# Patient Record
Sex: Male | Born: 1955 | Race: White | Hispanic: No | Marital: Married | Smoking: Current every day smoker
Health system: Southern US, Community
[De-identification: ages and names within clinical notes are randomized; demographics above are authoritative.]

## PROBLEM LIST (undated history)

## (undated) DIAGNOSIS — H269 Unspecified cataract: Secondary | ICD-10-CM

## (undated) DIAGNOSIS — E785 Hyperlipidemia, unspecified: Secondary | ICD-10-CM

## (undated) DIAGNOSIS — N529 Male erectile dysfunction, unspecified: Secondary | ICD-10-CM

## (undated) DIAGNOSIS — G4730 Sleep apnea, unspecified: Secondary | ICD-10-CM

## (undated) DIAGNOSIS — K279 Peptic ulcer, site unspecified, unspecified as acute or chronic, without hemorrhage or perforation: Secondary | ICD-10-CM

## (undated) HISTORY — PX: OTHER SURGICAL HISTORY: SHX169

## (undated) HISTORY — DX: Male erectile dysfunction, unspecified: N52.9

## (undated) HISTORY — DX: Peptic ulcer, site unspecified, unspecified as acute or chronic, without hemorrhage or perforation: K27.9

## (undated) HISTORY — DX: Unspecified cataract: H26.9

## (undated) HISTORY — DX: Sleep apnea, unspecified: G47.30

## (undated) HISTORY — DX: Hyperlipidemia, unspecified: E78.5

---

## 2003-10-03 ENCOUNTER — Encounter: Admission: RE | Admit: 2003-10-03 | Discharge: 2003-10-03 | Payer: Self-pay | Admitting: Family Medicine

## 2008-06-11 ENCOUNTER — Emergency Department (HOSPITAL_COMMUNITY): Admission: EM | Admit: 2008-06-11 | Discharge: 2008-06-11 | Payer: Self-pay | Admitting: Emergency Medicine

## 2011-05-31 LAB — ETHANOL: Alcohol, Ethyl (B): 105 — ABNORMAL HIGH

## 2013-08-07 ENCOUNTER — Other Ambulatory Visit: Payer: Self-pay | Admitting: Family Medicine

## 2013-08-07 ENCOUNTER — Ambulatory Visit
Admission: RE | Admit: 2013-08-07 | Discharge: 2013-08-07 | Disposition: A | Discharge status: Home / Self Care | Payer: 59 | Source: Ambulatory Visit | Attending: Family Medicine | Admitting: Family Medicine

## 2013-08-07 DIAGNOSIS — R05 Cough: Secondary | ICD-10-CM

## 2013-08-07 DIAGNOSIS — R509 Fever, unspecified: Secondary | ICD-10-CM

## 2014-04-05 ENCOUNTER — Encounter: Payer: Self-pay | Admitting: *Deleted

## 2015-05-06 ENCOUNTER — Ambulatory Visit (INDEPENDENT_AMBULATORY_CARE_PROVIDER_SITE_OTHER): Payer: Commercial Managed Care - HMO | Admitting: Family Medicine

## 2015-05-06 VITALS — BP 138/82 | HR 87 | Temp 98.0°F | Resp 18 | Ht 70.0 in | Wt 190.0 lb

## 2015-05-06 DIAGNOSIS — J209 Acute bronchitis, unspecified: Secondary | ICD-10-CM

## 2015-05-06 DIAGNOSIS — F17200 Nicotine dependence, unspecified, uncomplicated: Secondary | ICD-10-CM

## 2015-05-06 DIAGNOSIS — Z720 Tobacco use: Secondary | ICD-10-CM

## 2015-05-06 MED ORDER — BENZONATATE 200 MG PO CAPS: 200.0000 mg | ORAL_CAPSULE | Freq: Three times a day (TID) | ORAL | Status: DC | PRN

## 2015-05-06 MED ORDER — IPRATROPIUM BROMIDE 0.02 % IN SOLN
0.5000 mg | Freq: Once | RESPIRATORY_TRACT | Status: AC
  Administered 2015-05-06: 0.5 mg via RESPIRATORY_TRACT

## 2015-05-06 MED ORDER — ALBUTEROL SULFATE (2.5 MG/3ML) 0.083% IN NEBU
2.5000 mg | INHALATION_SOLUTION | Freq: Once | RESPIRATORY_TRACT | Status: AC
  Administered 2015-05-06: 2.5 mg via RESPIRATORY_TRACT

## 2015-05-06 MED ORDER — AZITHROMYCIN 250 MG PO TABS: ORAL_TABLET | ORAL | Status: DC

## 2015-05-06 MED ORDER — GUAIFENESIN-CODEINE 100-10 MG/5ML PO SOLN: 5.0000 mL | Freq: Three times a day (TID) | ORAL | Status: DC | PRN

## 2015-05-06 NOTE — Patient Instructions (Signed)
Acute Bronchitis Bronchitis is inflammation of the airways that extend from the windpipe into the lungs (bronchi). The inflammation often causes mucus to develop. This leads to a cough, which is the most common symptom of bronchitis.  In acute bronchitis, the condition usually develops suddenly and goes away over time, usually in a couple weeks. Smoking, allergies, and asthma can make bronchitis worse. Repeated episodes of bronchitis may cause further lung problems.  CAUSES Acute bronchitis is most often caused by the same virus that causes a cold. The virus can spread from person to person (contagious) through coughing, sneezing, and touching contaminated objects. SIGNS AND SYMPTOMS   Cough.   Fever.   Coughing up mucus.   Body aches.   Chest congestion.   Chills.   Shortness of breath.   Sore throat.  DIAGNOSIS  Acute bronchitis is usually diagnosed through a physical exam. Your health care provider will also ask you questions about your medical history. Tests, such as chest X-rays, are sometimes done to rule out other conditions.  TREATMENT  Acute bronchitis usually goes away in a couple weeks. Oftentimes, no medical treatment is necessary. Medicines are sometimes given for relief of fever or cough. Antibiotic medicines are usually not needed but may be prescribed in certain situations. In some cases, an inhaler may be recommended to help reduce shortness of breath and control the cough. A cool mist vaporizer may also be used to help thin bronchial secretions and make it easier to clear the chest.  HOME CARE INSTRUCTIONS  Get plenty of rest.   Drink enough fluids to keep your urine clear or pale yellow (unless you have a medical condition that requires fluid restriction). Increasing fluids may help thin your respiratory secretions (sputum) and reduce chest congestion, and it will prevent dehydration.   Take medicines only as directed by your health care provider.  If  you were prescribed an antibiotic medicine, finish it all even if you start to feel better.  Avoid smoking and secondhand smoke. Exposure to cigarette smoke or irritating chemicals will make bronchitis worse. If you are a smoker, consider using nicotine gum or skin patches to help control withdrawal symptoms. Quitting smoking will help your lungs heal faster.   Reduce the chances of another bout of acute bronchitis by washing your hands frequently, avoiding people with cold symptoms, and trying not to touch your hands to your mouth, nose, or eyes.   Keep all follow-up visits as directed by your health care provider.  SEEK MEDICAL CARE IF: Your symptoms do not improve after 1 week of treatment.  SEEK IMMEDIATE MEDICAL CARE IF:  You develop an increased fever or chills.   You have chest pain.   You have severe shortness of breath.  You have bloody sputum.   You develop dehydration.  You faint or repeatedly feel like you are going to pass out.  You develop repeated vomiting.  You develop a severe headache. MAKE SURE YOU:   Understand these instructions.  Will watch your condition.  Will get help right away if you are not doing well or get worse. Document Released: 09/22/2004 Document Revised: 12/30/2013 Document Reviewed: 02/05/2013 Exit[employer] Patient Information 2015 [employer]. This information is not intended to replace advice given to you by your health care provider. Make sure you discuss any questions you have with your health care provider. Smoking Cessation, Tips for Success If you are ready to quit smoking, congratulations! You have chosen to help yourself be healthier. Cigarettes bring nicotine,   tar, carbon monoxide, and other irritants into your body. Your lungs, heart, and blood vessels will be able to work better without these poisons. There are many different ways to quit smoking. Nicotine gum, nicotine patches, a nicotine inhaler, or nicotine nasal spray can  help with physical craving. Hypnosis, support groups, and medicines help break the habit of smoking. WHAT THINGS CAN I DO TO MAKE QUITTING EASIER?  Here are some tips to help you quit for good:  Pick a date when you will quit smoking completely. Tell all of your friends and family about your plan to quit on that date.  Do not try to slowly cut down on the number of cigarettes you are smoking. Pick a quit date and quit smoking completely starting on that day.  Throw away all cigarettes.   Clean and remove all ashtrays from your home, work, and car.  On a card, write down your reasons for quitting. Carry the card with you and read it when you get the urge to smoke.  Cleanse your body of nicotine. Drink enough water and fluids to keep your urine clear or pale yellow. Do this after quitting to flush the nicotine from your body.  Learn to predict your moods. Do not let a bad situation be your excuse to have a cigarette. Some situations in your life might tempt you into wanting a cigarette.  Never have "just one" cigarette. It leads to wanting another and another. Remind yourself of your decision to quit.  Change habits associated with smoking. If you smoked while driving or when feeling stressed, try other activities to replace smoking. Stand up when drinking your coffee. Brush your teeth after eating. Sit in a different chair when you read the paper. Avoid alcohol while trying to quit, and try to drink fewer caffeinated beverages. Alcohol and caffeine may urge you to smoke.  Avoid foods and drinks that can trigger a desire to smoke, such as sugary or spicy foods and alcohol.  Ask people who smoke not to smoke around you.  Have something planned to do right after eating or having a cup of coffee. For example, plan to take a walk or exercise.  Try a relaxation exercise to calm you down and decrease your stress. Remember, you may be tense and nervous for the first 2 weeks after you quit, but  this will pass.  Find new activities to keep your hands busy. Play with a pen, coin, or rubber band. Doodle or draw things on paper.  Brush your teeth right after eating. This will help cut down on the craving for the taste of tobacco after meals. You can also try mouthwash.   Use oral substitutes in place of cigarettes. Try using lemon drops, carrots, cinnamon sticks, or chewing gum. Keep them handy so they are available when you have the urge to smoke.  When you have the urge to smoke, try deep breathing.  Designate your home as a nonsmoking area.  If you are a heavy smoker, ask your health care provider about a prescription for nicotine chewing gum. It can ease your withdrawal from nicotine.  Reward yourself. Set aside the cigarette money you save and buy yourself something nice.  Look for support from others. Join a support group or smoking cessation program. Ask someone at home or at work to help you with your plan to quit smoking.  Always ask yourself, "Do I need this cigarette or is this just a reflex?" Tell yourself, "Today, I choose   not to smoke," or "I do not want to smoke." You are reminding yourself of your decision to quit.  Do not replace cigarette smoking with electronic cigarettes (commonly called e-cigarettes). The safety of e-cigarettes is unknown, and some may contain harmful chemicals.  If you relapse, do not give up! Plan ahead and think about what you will do the next time you get the urge to smoke. HOW WILL I FEEL WHEN I QUIT SMOKING? You may have symptoms of withdrawal because your body is used to nicotine (the addictive substance in cigarettes). You may crave cigarettes, be irritable, feel very hungry, cough often, get headaches, or have difficulty concentrating. The withdrawal symptoms are only temporary. They are strongest when you first quit but will go away within 10-14 days. When withdrawal symptoms occur, stay in control. Think about your reasons for quitting.  Remind yourself that these are signs that your body is healing and getting used to being without cigarettes. Remember that withdrawal symptoms are easier to treat than the major diseases that smoking can cause.  Even after the withdrawal is over, expect periodic urges to smoke. However, these cravings are generally short lived and will go away whether you smoke or not. Do not smoke! WHAT RESOURCES ARE AVAILABLE TO HELP ME QUIT SMOKING? Your health care provider can direct you to community resources or hospitals for support, which may include:  Group support.  Education.  Hypnosis.  Therapy. Document Released: 05/13/2004 Document Revised: 12/30/2013 Document Reviewed: 01/31/2013 ExitCare Patient Information 2015 [employer]. This information is not intended to replace advice given to you by your health care provider. Make sure you discuss any questions you have with your health care provider.  

## 2015-05-06 NOTE — Progress Notes (Signed)
Subjective:    Patient ID: Dylan Riley, male    DOB: Jul 26, 1956, 59 y.o.   MRN: [id number] This chart was scribed for Delman Cheadle, MD by Zola Button, Medical Scribe. This patient was seen in Room 1 and the patient's care was started at 11:15 AM.   Chief Complaint  Patient presents with  . Cough    Onset 4 days  . Shortness of Breath  . Chest Pain    With breathing    HPI HPI Comments: Dylan Riley is a 59 y.o. male who presents to the [facility] complaining of gradual onset, waxing and waning URI symptoms that started 10 days ago. Patient reports having productive cough of yellow sputum, rhinorrhea, congestion, SOB, and chest pain with deep breaths. His symptoms are worse in the mornings. He states his symptoms have improved since yesterday. Patient denies fever and chills. He does admit to smoking. He has not had to use an inhaler in the past.   Past Medical History  Diagnosis Date  . PUD (peptic ulcer disease)   . Hyperlipidemia   . ED (erectile dysfunction)   . Sleep apnea   . Cataract    Current Outpatient Prescriptions on File Prior to Visit  Medication Sig Dispense Refill  . simvastatin (ZOCOR) 40 MG tablet Take 40 mg by mouth daily.    . diclofenac (VOLTAREN) 75 MG EC tablet Take 75 mg by mouth 2 (two) times daily.     No current facility-administered medications on file prior to visit.   No Known Allergies  Review of Systems  Constitutional: Negative for fever and chills.  HENT: Positive for congestion and rhinorrhea.   Respiratory: Positive for cough and shortness of breath.   Cardiovascular: Positive for chest pain.       Objective:  BP 138/82 mmHg  Pulse 87  Temp(Src) 98 F (36.7 C) (Oral)  Resp 18  Ht 5\' 10"  (1.778 m)  Wt 190 lb (86.183 kg)  BMI 27.26 kg/m2  SpO2 98%  Physical Exam  Constitutional: He is oriented to person, place, and time. He appears well-developed and well-nourished. No distress.  HENT:  Head:  Normocephalic and atraumatic.  Right Ear: Tympanic membrane normal.  Left Ear: Tympanic membrane normal.  Nose: Nose normal.  Mouth/Throat: Oropharynx is clear and moist. No oropharyngeal exudate, posterior oropharyngeal edema, posterior oropharyngeal erythema or tonsillar abscesses.  Nares normal.  Eyes: Pupils are equal, round, and reactive to light.  Neck: Neck supple. No thyromegaly present.  Normal thyroid.  Cardiovascular: Normal rate, regular rhythm, S1 normal, S2 normal and normal heart sounds.   No murmur heard. Pulmonary/Chest: Effort normal.  Clear to auscultation bilaterally but upper bronchiole breath sounds.  Musculoskeletal: He exhibits no edema.  Lymphadenopathy:    He has no cervical adenopathy.  Neurological: He is alert and oriented to person, place, and time. No cranial nerve deficit.  Skin: Skin is warm and dry. No rash noted.  Psychiatric: He has a normal mood and affect. His behavior is normal.  Nursing note and vitals reviewed.   Repeat exam after Duoneb treatment: symptoms unchanged.       Assessment & Plan:  Patient told to call if he wants an inhaler called for him. 1. Acute bronchitis, unspecified organism   2. Tobacco use disorder     Meds ordered this encounter  Medications  . albuterol (PROVENTIL) (2.5 MG/3ML) 0.083% nebulizer solution 2.5 mg    Sig:   . ipratropium (ATROVENT)  nebulizer solution 0.5 mg    Sig:   . azithromycin (ZITHROMAX) 250 MG tablet    Sig: Take 2 tabs PO x 1 dose, then 1 tab PO QD x 4 days    Dispense:  6 tablet    Refill:  0  . benzonatate (TESSALON) 200 MG capsule    Sig: Take 1 capsule (200 mg total) by mouth 3 (three) times daily as needed for cough.    Dispense:  60 capsule    Refill:  0  . guaiFENesin-codeine 100-10 MG/5ML syrup    Sig: Take 5-10 mLs by mouth 3 (three) times daily as needed for cough.    Dispense:  120 mL    Refill:  0    I personally performed the services described in this documentation,  which was scribed in my presence. The recorded information has been reviewed and considered, and addended by me as needed.  Delman Cheadle, MD MPH

## 2020-06-09 DIAGNOSIS — R69 Illness, unspecified: Secondary | ICD-10-CM | POA: Diagnosis not present

## 2020-06-25 DIAGNOSIS — R69 Illness, unspecified: Secondary | ICD-10-CM | POA: Diagnosis not present

## 2020-08-23 DIAGNOSIS — R69 Illness, unspecified: Secondary | ICD-10-CM | POA: Diagnosis not present

## 2020-11-14 DIAGNOSIS — J019 Acute sinusitis, unspecified: Secondary | ICD-10-CM | POA: Diagnosis not present

## 2020-11-26 DIAGNOSIS — E782 Mixed hyperlipidemia: Secondary | ICD-10-CM | POA: Diagnosis not present

## 2020-11-26 DIAGNOSIS — Z79899 Other long term (current) drug therapy: Secondary | ICD-10-CM | POA: Diagnosis not present

## 2020-11-26 DIAGNOSIS — Z1159 Encounter for screening for other viral diseases: Secondary | ICD-10-CM | POA: Diagnosis not present

## 2020-11-26 DIAGNOSIS — Z0000 Encounter for general adult medical examination without abnormal findings: Secondary | ICD-10-CM | POA: Diagnosis not present

## 2020-11-26 DIAGNOSIS — Z23 Encounter for immunization: Secondary | ICD-10-CM | POA: Diagnosis not present

## 2020-11-26 DIAGNOSIS — Z86010 Personal history of colonic polyps: Secondary | ICD-10-CM | POA: Diagnosis not present

## 2020-11-26 DIAGNOSIS — R69 Illness, unspecified: Secondary | ICD-10-CM | POA: Diagnosis not present

## 2021-04-03 DIAGNOSIS — J069 Acute upper respiratory infection, unspecified: Secondary | ICD-10-CM | POA: Diagnosis not present

## 2021-04-03 DIAGNOSIS — U071 COVID-19: Secondary | ICD-10-CM | POA: Diagnosis not present

## 2021-04-10 DIAGNOSIS — Z1152 Encounter for screening for COVID-19: Secondary | ICD-10-CM | POA: Diagnosis not present

## 2021-04-10 DIAGNOSIS — R030 Elevated blood-pressure reading, without diagnosis of hypertension: Secondary | ICD-10-CM | POA: Diagnosis not present

## 2021-04-10 DIAGNOSIS — Z8616 Personal history of COVID-19: Secondary | ICD-10-CM | POA: Diagnosis not present

## 2021-08-04 DIAGNOSIS — B078 Other viral warts: Secondary | ICD-10-CM | POA: Diagnosis not present

## 2021-08-04 DIAGNOSIS — D2239 Melanocytic nevi of other parts of face: Secondary | ICD-10-CM | POA: Diagnosis not present

## 2021-08-04 DIAGNOSIS — Z1283 Encounter for screening for malignant neoplasm of skin: Secondary | ICD-10-CM | POA: Diagnosis not present

## 2021-08-04 DIAGNOSIS — L820 Inflamed seborrheic keratosis: Secondary | ICD-10-CM | POA: Diagnosis not present

## 2021-08-04 DIAGNOSIS — D225 Melanocytic nevi of trunk: Secondary | ICD-10-CM | POA: Diagnosis not present

## 2021-10-07 DIAGNOSIS — M25561 Pain in right knee: Secondary | ICD-10-CM | POA: Diagnosis not present

## 2021-10-07 DIAGNOSIS — H6121 Impacted cerumen, right ear: Secondary | ICD-10-CM | POA: Diagnosis not present

## 2021-10-07 DIAGNOSIS — J069 Acute upper respiratory infection, unspecified: Secondary | ICD-10-CM | POA: Diagnosis not present

## 2021-11-29 DIAGNOSIS — E782 Mixed hyperlipidemia: Secondary | ICD-10-CM | POA: Diagnosis not present

## 2021-11-29 DIAGNOSIS — Z0000 Encounter for general adult medical examination without abnormal findings: Secondary | ICD-10-CM | POA: Diagnosis not present

## 2021-11-29 DIAGNOSIS — R69 Illness, unspecified: Secondary | ICD-10-CM | POA: Diagnosis not present

## 2021-11-29 DIAGNOSIS — Z122 Encounter for screening for malignant neoplasm of respiratory organs: Secondary | ICD-10-CM | POA: Diagnosis not present

## 2021-11-29 DIAGNOSIS — Z86010 Personal history of colonic polyps: Secondary | ICD-10-CM | POA: Diagnosis not present

## 2021-11-29 DIAGNOSIS — J3489 Other specified disorders of nose and nasal sinuses: Secondary | ICD-10-CM | POA: Diagnosis not present

## 2021-11-29 DIAGNOSIS — Z79899 Other long term (current) drug therapy: Secondary | ICD-10-CM | POA: Diagnosis not present

## 2021-11-29 DIAGNOSIS — Z125 Encounter for screening for malignant neoplasm of prostate: Secondary | ICD-10-CM | POA: Diagnosis not present

## 2022-01-04 ENCOUNTER — Other Ambulatory Visit: Payer: Self-pay

## 2022-01-04 DIAGNOSIS — Z122 Encounter for screening for malignant neoplasm of respiratory organs: Secondary | ICD-10-CM

## 2022-01-04 DIAGNOSIS — Z87891 Personal history of nicotine dependence: Secondary | ICD-10-CM

## 2022-01-04 DIAGNOSIS — F17210 Nicotine dependence, cigarettes, uncomplicated: Secondary | ICD-10-CM

## 2022-01-10 DIAGNOSIS — R059 Cough, unspecified: Secondary | ICD-10-CM | POA: Diagnosis not present

## 2022-01-19 ENCOUNTER — Encounter: Payer: Self-pay | Admitting: Acute Care

## 2022-01-19 ENCOUNTER — Ambulatory Visit (INDEPENDENT_AMBULATORY_CARE_PROVIDER_SITE_OTHER): Payer: Medicare HMO | Admitting: Acute Care

## 2022-01-19 DIAGNOSIS — F17210 Nicotine dependence, cigarettes, uncomplicated: Secondary | ICD-10-CM

## 2022-01-19 DIAGNOSIS — R69 Illness, unspecified: Secondary | ICD-10-CM | POA: Diagnosis not present

## 2022-01-19 NOTE — Progress Notes (Signed)
Virtual Visit via Telephone Note  I connected with JERELD PRESTI on 01/19/22 at 10:30 AM EDT by telephone and verified that I am speaking with the correct person using two identifiers.  Location: Patient:  At home Provider: [address], [place], [place], [address]    I discussed the limitations, risks, security and privacy concerns of performing an evaluation and management service by telephone and the availability of in person appointments. I also discussed with the patient that there may be a patient responsible charge related to this service. The patient expressed understanding and agreed to proceed.     Shared Decision Making Visit Lung Cancer Screening Program [phone])   Eligibility: Age 66 y.o. Pack Years Smoking History Calculation 35 pack year smoking history (# packs/per year x # years smoked) Recent History of coughing up blood  no Unexplained weight loss? no ( >Than 15 pounds within the last 6 months ) Prior History Lung / other cancer no (Diagnosis within the last 5 years already requiring surveillance chest CT Scans). Smoking Status Current Smoker Former Smokers: Years since quit:  NA  Quit Date:  NA  Visit Components: Discussion included one or more decision making aids. yes Discussion included risk/benefits of screening. yes Discussion included potential follow up diagnostic testing for abnormal scans. yes Discussion included meaning and risk of over diagnosis. yes Discussion included meaning and risk of False Positives. yes Discussion included meaning of total radiation exposure. yes  Counseling Included: Importance of adherence to annual lung cancer LDCT screening. yes Impact of comorbidities on ability to participate in the program. yes Ability and willingness to under diagnostic treatment. yes  Smoking Cessation Counseling: Current Smokers:  Discussed importance of smoking cessation. yes Information about tobacco cessation classes and  interventions provided to patient. yes Patient provided with "ticket" for LDCT Scan. yes Symptomatic Patient. no  Counseling NA Diagnosis Code: Tobacco Use Z72.0 Asymptomatic Patient yes  Counseling (Intermediate counseling: > three minutes counseling) [id number] Former Smokers:  Discussed the importance of maintaining cigarette abstinence. yes Diagnosis Code: Personal History of Nicotine Dependence. F64.332 Information about tobacco cessation classes and interventions provided to patient. Yes Patient provided with "ticket" for LDCT Scan. yes Written Order for Lung Cancer Screening with LDCT placed in Epic. Yes (CT Chest Lung Cancer Screening Low Dose W/O CM) [id number] Z12.2-Screening of respiratory organs Z87.891-Personal history of nicotine dependence  I have spent 25 minutes of face to face/ virtual visit   time with  Mr. Strothers discussing the risks and benefits of lung cancer screening. We viewed / discussed a power point together that explained in detail the above noted topics. We paused at intervals to allow for questions to be asked and answered to ensure understanding.We discussed that the single most powerful action that he can take to decrease his risk of developing lung cancer is to quit smoking. We discussed whether or not he is ready to commit to setting a quit date. We discussed options for tools to aid in quitting smoking including nicotine replacement therapy, non-nicotine medications, support groups, Quit Smart classes, and behavior modification. We discussed that often times setting smaller, more achievable goals, such as eliminating 1 cigarette a day for a week and then 2 cigarettes a day for a week can be helpful in slowly decreasing the number of cigarettes smoked. This allows for a sense of accomplishment as well as providing a clinical benefit. I provided  him  with smoking cessation  information  with contact information for community resources,  classes, free nicotine replacement  therapy, and access to mobile apps, text messaging, and on-line smoking cessation help. I have also provided  him  the office contact information in the event he needs to contact me, or the screening staff. We discussed the time and location of the scan, and that either Doroteo Glassman RN, Joella Prince, RN  or I will call / send a letter with the results within 24-72 hours of receiving them. The patient verbalized understanding of all of  the above and had no further questions upon leaving the office. They have my contact information in the event they have any further questions.  I spent 3 minutes counseling on smoking cessation and the health risks of continued tobacco abuse.  I explained to the patient that there has been a high incidence of coronary artery disease noted on these exams. I explained that this is a non-gated exam therefore degree or severity cannot be determined. This patient is on statin therapy. I have asked the patient to follow-up with their PCP regarding any incidental finding of coronary artery disease and management with diet or medication as their PCP  feels is clinically indicated. The patient verbalized understanding of the above and had no further questions upon completion of the visit.      Magdalen Spatz, NP 01/19/2022

## 2022-01-19 NOTE — Patient Instructions (Signed)
Thank you for participating in the [facility]. It was our pleasure to meet you today. We will call you with the results of your scan within the next few days. Your scan will be assigned a Lung RADS category score by the physicians reading the scans.  This Lung RADS score determines follow up scanning.  See below for description of categories, and follow up screening recommendations. We will be in touch to schedule your follow up screening annually or based on recommendations of our providers. We will fax a copy of your scan results to your Primary Care Physician, or the physician who referred you to the program, to ensure they have the results. Please call the office if you have any questions or concerns regarding your scanning experience or results.  Our office number is [phone]. Please speak with Denise Phelps, RN. , or  Denise Buckner RN, They are  our Lung Cancer Screening RN.'s If They are unavailable when you call, Please leave a message on the voice mail. We will return your call at our earliest convenience.This voice mail is monitored several times a day.  Remember, if your scan is normal, we will scan you annually as long as you continue to meet the criteria for the program. (Age 55-77, Current smoker or smoker who has quit within the last 15 years). If you are a smoker, remember, quitting is the single most powerful action that you can take to decrease your risk of lung cancer and other pulmonary, breathing related problems. We know quitting is hard, and we are here to help.  Please let us know if there is anything we can do to help you meet your goal of quitting. If you are a former smoker, congratulations. We are proud of you! Remain smoke free! Remember you can refer friends or family members through the number above.  We will screen them to make sure they meet criteria for the program. Thank you for helping us take better care of you by  participating in Lung Screening.  You can receive free nicotine replacement therapy ( patches, gum or mints) by calling 1-800-QUIT NOW. Please call so we can get you on the path to becoming  a non-smoker. I know it is hard, but you can do this!  Lung RADS Categories:  Lung RADS 1: no nodules or definitely non-concerning nodules.  Recommendation is for a repeat annual scan in 12 months.  Lung RADS 2:  nodules that are non-concerning in appearance and behavior with a very low likelihood of becoming an active cancer. Recommendation is for a repeat annual scan in 12 months.  Lung RADS 3: nodules that are probably non-concerning , includes nodules with a low likelihood of becoming an active cancer.  Recommendation is for a 6-month repeat screening scan. Often noted after an upper respiratory illness. We will be in touch to make sure you have no questions, and to schedule your 6-month scan.  Lung RADS 4 A: nodules with concerning findings, recommendation is most often for a follow up scan in 3 months or additional testing based on our provider's assessment of the scan. We will be in touch to make sure you have no questions and to schedule the recommended 3 month follow up scan.  Lung RADS 4 B:  indicates findings that are concerning. We will be in touch with you to schedule additional diagnostic testing based on our provider's  assessment of the scan.  Other options for assistance in smoking cessation (   As covered by your insurance benefits)  Hypnosis for smoking cessation  [facility] [employer] [phone]  Acupuncture for smoking cessation  [facility] [phone]   

## 2022-01-26 ENCOUNTER — Other Ambulatory Visit: Payer: Self-pay

## 2022-01-26 ENCOUNTER — Ambulatory Visit
Admission: RE | Admit: 2022-01-26 | Discharge: 2022-01-26 | Disposition: A | Discharge status: Home / Self Care | Payer: Medicare HMO | Source: Ambulatory Visit | Attending: Family Medicine | Admitting: Family Medicine

## 2022-01-26 DIAGNOSIS — Z122 Encounter for screening for malignant neoplasm of respiratory organs: Secondary | ICD-10-CM

## 2022-01-26 DIAGNOSIS — Z87891 Personal history of nicotine dependence: Secondary | ICD-10-CM

## 2022-01-26 DIAGNOSIS — F17210 Nicotine dependence, cigarettes, uncomplicated: Secondary | ICD-10-CM

## 2022-01-26 DIAGNOSIS — I2510 Atherosclerotic heart disease of native coronary artery without angina pectoris: Secondary | ICD-10-CM | POA: Diagnosis not present

## 2022-01-26 DIAGNOSIS — K8020 Calculus of gallbladder without cholecystitis without obstruction: Secondary | ICD-10-CM | POA: Diagnosis not present

## 2022-01-26 DIAGNOSIS — J432 Centrilobular emphysema: Secondary | ICD-10-CM | POA: Diagnosis not present

## 2022-01-26 DIAGNOSIS — R69 Illness, unspecified: Secondary | ICD-10-CM | POA: Diagnosis not present

## 2022-01-28 ENCOUNTER — Telehealth: Payer: Self-pay

## 2022-01-28 NOTE — Telephone Encounter (Signed)
NOTES SCANNED TO REFERRAL 

## 2022-02-06 NOTE — Progress Notes (Unsigned)
Cardiology Office Note:   Date:  02/09/2022  NAME:  Dylan Riley    MRN: [id number] DOB:  May 06, 1956   PCP:  Gaynelle Arabian, MD  Cardiologist:  None  Electrophysiologist:  None   Referring MD: Gaynelle Arabian, MD   Chief Complaint  Patient presents with   coronary calcification   History of Present Illness:   Dylan Riley is a 66 y.o. male with a hx of HLD, tobacco abuse who is being seen today for the evaluation of coronary calcifications at the request of Gaynelle Arabian, MD. CT scan dated 01/26/2022 was reviewed in office.  This demonstrates mild to moderate three-vessel CAD.  He reports no chest pain.  He is starting to exercise more since finding out that he has calcium in his heart arteries.  He reports no chest pain or pressure.  His EKG demonstrates sinus rhythm with no acute changes.  He does not have a history of hypertension.  He is a current tobacco user.  He has smoked for nearly 20 to 25 years.  He is smoking one third of a pack per day.  He does have a lipid profile with elevated triglycerides.  He reports he has started walking more and does get short of breath with activity.  Again no chest pain or pressure.  There is a strong family history of heart disease in his family members.  He is married.  He has 2 children.  He reports he is retired.  He worked for a [employer].  He is quite alarmed by the calcium in his heart.  Still smoking a third of a pack per day.  We discussed that he needs to stop smoking.  We also discussed more intensive lipid-lowering agents.  Triglycerides are elevated.  We discussed switching to Lipitor and he is okay to do this.  We also discussed coronary CTA to further evaluate things.  He does have left main coronary calcium.  T chol 113, HDL 29, LDL 39, TG 298  Problem List Coronary calcifications  HLD Tobacco abuse   Past Medical History: Past Medical History:  Diagnosis Date   Cataract    ED (erectile dysfunction)     Hyperlipidemia    PUD (peptic ulcer disease)    Sleep apnea     Past Surgical History: Past Surgical History:  Procedure Laterality Date   LEFT KNEE CYST      Current Medications: Current Meds  Medication Sig   atorvastatin (LIPITOR) 40 MG tablet Take 1 tablet (40 mg total) by mouth daily.   ibuprofen (ADVIL) 200 MG tablet Take 200 mg by mouth daily.   Melatonin (MELATONIN EXTRA STRENGTH) 10 MG TABS 1 gummy   metoprolol tartrate (LOPRESSOR) 100 MG tablet Take 1 tablet by mouth once for procedure.   Omega-3 Fatty Acids (FISH OIL) 1000 MG CAPS 1 capsule   [DISCONTINUED] simvastatin (ZOCOR) 40 MG tablet Take 40 mg by mouth daily.     Allergies:    Patient has no known allergies.   Social History: Social History   Socioeconomic History   Marital status: Married    Spouse name: Not on file   Number of children: 2   Years of education: Not on file   Highest education level: Not on file  Occupational History   Occupation: Retired [place]  Tobacco Use   Smoking status: Every Day    Years: 35.00    Types: Cigarettes   Smokeless tobacco: Not on file  Substance and Sexual  Activity   Alcohol use: No    Alcohol/week: 0.0 standard drinks of alcohol   Drug use: No   Sexual activity: Not on file  Other Topics Concern   Not on file  Social History Narrative   Not on file   Social Determinants of Health   Financial Resource Strain: Not on file  Food Insecurity: Not on file  Transportation Needs: Not on file  Physical Activity: Not on file  Stress: Not on file  Social Connections: Not on file     Family History: The patient's family history includes COPD in his father; Cancer in his sister; Diabetes in his mother; Heart disease in his mother; Hyperlipidemia in his mother.  ROS:   All other ROS reviewed and negative. Pertinent positives noted in the HPI.     EKGs/Labs/Other Studies Reviewed:   The following studies were personally reviewed by me today:  EKG:   EKG is ordered today.  The ekg ordered today demonstrates normal sinus rhythm heart rate 81, no acute ischemic changes or evidence of infarction, and was personally reviewed by me.   Recent Labs: No results found for requested labs within last 365 days.   Recent Lipid Panel No results found for: "CHOL", "TRIG", "HDL", "CHOLHDL", "VLDL", "LDLCALC", "LDLDIRECT"  Physical Exam:   VS:  BP 128/68   Pulse 81   Ht '5\' 10"'$  (1.778 m)   Wt 193 lb (87.5 kg)   SpO2 97%   BMI 27.69 kg/m    Wt Readings from Last 3 Encounters:  02/09/22 193 lb (87.5 kg)  05/06/15 190 lb (86.2 kg)    General: Well nourished, well developed, in no acute distress Head: Atraumatic, normal size  Eyes: PEERLA, EOMI  Neck: Supple, no JVD Endocrine: No thryomegaly Cardiac: Normal S1, S2; RRR; no murmurs, rubs, or gallops Lungs: Clear to auscultation bilaterally, no wheezing, rhonchi or rales  Abd: Soft, nontender, no hepatomegaly  Ext: No edema, pulses 2+ Musculoskeletal: No deformities, BUE and BLE strength normal and equal Skin: Warm and dry, no rashes   Neuro: Alert and oriented to person, place, time, and situation, CNII-XII grossly intact, no focal deficits  Psych: Normal mood and affect   ASSESSMENT:   Dylan Riley is a 66 y.o. male who presents for the following: 1. Coronary artery calcification seen on CAT scan   2. Coronary artery disease involving native coronary artery of native heart without angina pectoris   3. SOB (shortness of breath)   4. Tobacco abuse     PLAN:   1. Coronary artery calcification seen on CAT scan 2. Coronary artery disease involving native coronary artery of native heart without angina pectoris 3. SOB (shortness of breath) -Recent CT lung cancer screening with moderate coronary calcium.  Also has left main calcium.  He does get short of breath with exertion but has not been active for quite some time.  He screens negative for PAD.  He has good peripheral pulses.  His EKG in  office demonstrates sinus rhythm.  He reports no chest pain however I am a bit alarmed by the level of calcium.  I recommended coronary CTA to further evaluate this.  We do make sure he does not have any blockages that merit revascularization from a mortality standpoint.  His exam is otherwise unremarkable.  He will see me back in 3 months.  We will start him on Lipitor and stop his simvastatin.  We will recheck lipids including a direct LDL when he sees me  back.  He was advised to work on his diet and exercise more.  We will discuss further preventive strategies assuming he has no major blockage when I see him back in the office.  Smoking cessation was recommended.  See discussion below.  4. Tobacco abuse -20+ pack years.  4 minutes of smoking cessation counseling was provided in office today.  Disposition: Return in about 3 months (around 05/12/2022).  Medication Adjustments/Labs and Tests Ordered: Current medicines are reviewed at length with the patient today.  Concerns regarding medicines are outlined above.  Orders Placed This Encounter  Procedures   CT CORONARY MORPH W/CTA COR W/SCORE W/CA W/CM &/OR WO/CM   Basic metabolic panel   Lipid panel   LDL cholesterol, direct   EKG 12-Lead   Meds ordered this encounter  Medications   metoprolol tartrate (LOPRESSOR) 100 MG tablet    Sig: Take 1 tablet by mouth once for procedure.    Dispense:  1 tablet    Refill:  0   atorvastatin (LIPITOR) 40 MG tablet    Sig: Take 1 tablet (40 mg total) by mouth daily.    Dispense:  90 tablet    Refill:  3    Patient Instructions  Medication Instructions:  TAKE Metoprolol 100 mg two hours before CT scan when scheduled.   STOP Zocor (Simvastatin)  START Lipitor (Atorvastatin) 40 mg daily   *If you need a refill on your cardiac medications before your next appointment, please call your pharmacy*   Lab Work: -BMET today   -LIPID, direct LDL (come back 1 week before follow up, no lab appointment  needed, come back fasting- nothing to eat or drink)   If you have labs (blood work) drawn today and your tests are completely normal, you will receive your results only by: [place] (if you have MyChart) OR A paper copy in the mail If you have any lab test that is abnormal or we need to change your treatment, we will call you to review the results.   Testing/Procedures: Your physician has requested that you have cardiac CT. Cardiac computed tomography (CT) is a painless test that uses an x-ray machine to take clear, detailed pictures of your heart. For further information please visit [email or web address].[email or web address]. Please follow instruction sheet as given.   Follow-Up: At [facility], you and your health needs are our priority.  As part of our continuing mission to provide you with exceptional heart care, we have created designated Provider Care Teams.  These Care Teams include your primary Cardiologist (physician) and Advanced Practice Providers (APPs -  Physician Assistants and Nurse Practitioners) who all work together to provide you with the care you need, when you need it.  We recommend signing up for the patient portal called "MyChart".  Sign up information is provided on this After Visit Summary.  MyChart is used to connect with patients for Virtual Visits (Telemedicine).  Patients are able to view lab/test results, encounter notes, upcoming appointments, etc.  Non-urgent messages can be sent to your provider as well.   To learn more about what you can do with MyChart, go to [email or web address].    Your next appointment:   3 month(s)  The format for your next appointment:   In Person  Provider:   Eleonore Chiquito, MD     Other Instructions   Your cardiac CT will be scheduled at one of the below locations:   [facility] [address] [place], [place] [address] [phone]  If scheduled at [facility], please arrive at the [facility] and Children's  Entrance (Entrance C2) of [facility] 30 minutes prior to test start time. You can use the FREE valet parking offered at entrance C (encouraged to control the heart rate for the test)  Proceed to the [facility] Radiology Department (first floor) to check-in and test prep.  All radiology patients and guests should use entrance C2 at [facility], accessed from [facility], even though the hospital's physical address listed is [address]     Please follow these instructions carefully (unless otherwise directed):  Hold all erectile dysfunction medications at least 3 days (72 hrs) prior to test.  On the Night Before the Test: Be sure to Drink plenty of water. Do not consume any caffeinated/decaffeinated beverages or chocolate 12 hours prior to your test. Do not take any antihistamines 12 hours prior to your test.  On the Day of the Test: Drink plenty of water until 1 hour prior to the test. Do not eat any food 4 hours prior to the test. You may take your regular medications prior to the test.  Take metoprolol (Lopressor) two hours prior to test. HOLD Furosemide/Hydrochlorothiazide morning of the test. FEMALES- please wear underwire-free bra if available, avoid dresses & tight clothing      After the Test: Drink plenty of water. After receiving IV contrast, you may experience a mild flushed feeling. This is normal. On occasion, you may experience a mild rash up to 24 hours after the test. This is not dangerous. If this occurs, you can take Benadryl 25 mg and increase your fluid intake. If you experience trouble breathing, this can be serious. If it is severe call 911 IMMEDIATELY. If it is mild, please call our office. If you take any of these medications: Glipizide/Metformin, Avandament, Glucavance, please do not take 48 hours after completing test unless otherwise instructed.  We will call to schedule your test 2-4 weeks out understanding that some  insurance companies will need an authorization prior to the service being performed.   For non-scheduling related questions, please contact the cardiac imaging nurse navigator should you have any questions/concerns: Marchia Bond, Cardiac Imaging Nurse Navigator Gordy Clement, Cardiac Imaging Nurse Navigator [facility] Direct Office Dial: [phone]   For scheduling needs, including cancellations and rescheduling, please call [place], [phone].           Signed, Addison Naegeli. Audie Box, MD, [place]  [address], [place], [place] [address] [phone]  02/09/2022 9:24 AM

## 2022-02-09 ENCOUNTER — Ambulatory Visit (INDEPENDENT_AMBULATORY_CARE_PROVIDER_SITE_OTHER): Payer: Medicare HMO | Admitting: Cardiovascular Disease

## 2022-02-09 ENCOUNTER — Encounter: Payer: Self-pay | Admitting: Cardiovascular Disease

## 2022-02-09 VITALS — BP 128/68 | HR 81 | Ht 70.0 in | Wt 193.0 lb

## 2022-02-09 DIAGNOSIS — F17210 Nicotine dependence, cigarettes, uncomplicated: Secondary | ICD-10-CM

## 2022-02-09 DIAGNOSIS — R0602 Shortness of breath: Secondary | ICD-10-CM

## 2022-02-09 DIAGNOSIS — I2510 Atherosclerotic heart disease of native coronary artery without angina pectoris: Secondary | ICD-10-CM

## 2022-02-09 DIAGNOSIS — Z720 Tobacco use: Secondary | ICD-10-CM

## 2022-02-09 DIAGNOSIS — R69 Illness, unspecified: Secondary | ICD-10-CM | POA: Diagnosis not present

## 2022-02-09 LAB — BASIC METABOLIC PANEL
BUN/Creatinine Ratio: 20 (ref 10–24)
BUN: 15 mg/dL (ref 8–27)
CO2: 27 mmol/L (ref 20–29)
Calcium: 9.7 mg/dL (ref 8.6–10.2)
Chloride: 104 mmol/L (ref 96–106)
Creatinine, Ser: 0.75 mg/dL — ABNORMAL LOW (ref 0.76–1.27)
Glucose: 97 mg/dL (ref 70–99)
Potassium: 5 mmol/L (ref 3.5–5.2)
Sodium: 144 mmol/L (ref 134–144)
eGFR: 100 mL/min/{1.73_m2} (ref >59)

## 2022-02-09 MED ORDER — METOPROLOL TARTRATE 100 MG PO TABS: ORAL_TABLET | ORAL | 0 refills | Status: DC

## 2022-02-09 MED ORDER — ATORVASTATIN CALCIUM 40 MG PO TABS: 40.0000 mg | ORAL_TABLET | Freq: Every day | ORAL | 3 refills | Status: DC

## 2022-02-09 NOTE — Patient Instructions (Signed)
Medication Instructions:  TAKE Metoprolol 100 mg two hours before CT scan when scheduled.   STOP Zocor (Simvastatin)  START Lipitor (Atorvastatin) 40 mg daily   *If you need a refill on your cardiac medications before your next appointment, please call your pharmacy*   Lab Work: -BMET today   -LIPID, direct LDL (come back 1 week before follow up, no lab appointment needed, come back fasting- nothing to eat or drink)   If you have labs (blood work) drawn today and your tests are completely normal, you will receive your results only by: [place] (if you have MyChart) OR A paper copy in the mail If you have any lab test that is abnormal or we need to change your treatment, we will call you to review the results.   Testing/Procedures: Your physician has requested that you have cardiac CT. Cardiac computed tomography (CT) is a painless test that uses an x-ray machine to take clear, detailed pictures of your heart. For further information please visit [email or web address].[email or web address]. Please follow instruction sheet as given.   Follow-Up: At [facility], you and your health needs are our priority.  As part of our continuing mission to provide you with exceptional heart care, we have created designated Provider Care Teams.  These Care Teams include your primary Cardiologist (physician) and Advanced Practice Providers (APPs -  Physician Assistants and Nurse Practitioners) who all work together to provide you with the care you need, when you need it.  We recommend signing up for the patient portal called "MyChart".  Sign up information is provided on this After Visit Summary.  MyChart is used to connect with patients for Virtual Visits (Telemedicine).  Patients are able to view lab/test results, encounter notes, upcoming appointments, etc.  Non-urgent messages can be sent to your provider as well.   To learn more about what you can do with MyChart, go to [email or web address].    Your next  appointment:   3 month(s)  The format for your next appointment:   In Person  Provider:   Eleonore Chiquito, MD     Other Instructions   Your cardiac CT will be scheduled at one of the below locations:   [facility] [address] [place], [place] [address] [phone]   If scheduled at [facility], please arrive at the [facility] and Children's Entrance (Entrance C2) of [facility] 30 minutes prior to test start time. You can use the FREE valet parking offered at entrance C (encouraged to control the heart rate for the test)  Proceed to the [facility] Radiology Department (first floor) to check-in and test prep.  All radiology patients and guests should use entrance C2 at [facility], accessed from [facility], even though the hospital's physical address listed is [address].     Please follow these instructions carefully (unless otherwise directed):  Hold all erectile dysfunction medications at least 3 days (72 hrs) prior to test.  On the Night Before the Test: Be sure to Drink plenty of water. Do not consume any caffeinated/decaffeinated beverages or chocolate 12 hours prior to your test. Do not take any antihistamines 12 hours prior to your test.  On the Day of the Test: Drink plenty of water until 1 hour prior to the test. Do not eat any food 4 hours prior to the test. You may take your regular medications prior to the test.  Take metoprolol (Lopressor) two hours prior to test. HOLD Furosemide/Hydrochlorothiazide morning  of the test. FEMALES- please wear underwire-free bra if available, avoid dresses & tight clothing      After the Test: Drink plenty of water. After receiving IV contrast, you may experience a mild flushed feeling. This is normal. On occasion, you may experience a mild rash up to 24 hours after the test. This is not dangerous. If this occurs, you can take Benadryl 25 mg and increase your  fluid intake. If you experience trouble breathing, this can be serious. If it is severe call 911 IMMEDIATELY. If it is mild, please call our office. If you take any of these medications: Glipizide/Metformin, Avandament, Glucavance, please do not take 48 hours after completing test unless otherwise instructed.  We will call to schedule your test 2-4 weeks out understanding that some insurance companies will need an authorization prior to the service being performed.   For non-scheduling related questions, please contact the cardiac imaging nurse navigator should you have any questions/concerns: Marchia Bond, Cardiac Imaging Nurse Navigator Gordy Clement, Cardiac Imaging Nurse Navigator [place] [facility] Direct Office Dial: [phone]   For scheduling needs, including cancellations and rescheduling, please call [place], [phone].

## 2022-03-02 DIAGNOSIS — K5730 Diverticulosis of large intestine without perforation or abscess without bleeding: Secondary | ICD-10-CM | POA: Diagnosis not present

## 2022-03-02 DIAGNOSIS — K648 Other hemorrhoids: Secondary | ICD-10-CM | POA: Diagnosis not present

## 2022-03-02 DIAGNOSIS — Z09 Encounter for follow-up examination after completed treatment for conditions other than malignant neoplasm: Secondary | ICD-10-CM | POA: Diagnosis not present

## 2022-03-02 DIAGNOSIS — Z86010 Personal history of colonic polyps: Secondary | ICD-10-CM | POA: Diagnosis not present

## 2022-03-02 DIAGNOSIS — K621 Rectal polyp: Secondary | ICD-10-CM | POA: Diagnosis not present

## 2022-03-07 ENCOUNTER — Telehealth (HOSPITAL_COMMUNITY): Payer: Self-pay | Admitting: Emergency Medicine

## 2022-03-07 DIAGNOSIS — K621 Rectal polyp: Secondary | ICD-10-CM | POA: Diagnosis not present

## 2022-03-07 NOTE — Telephone Encounter (Signed)
Reaching out to patient to offer assistance regarding upcoming cardiac imaging study; pt verbalizes understanding of appt date/time, parking situation and where to check in, pre-test NPO status and medications ordered, and verified current allergies; name and call back number provided for further questions should they arise Marchia Bond RN Navigator Cardiac Imaging [facility] [phone] office [phone] cell  Arrival 1030 Denies iv issues '100mg'$  metoprolol tartrate

## 2022-03-08 ENCOUNTER — Ambulatory Visit (HOSPITAL_COMMUNITY)
Admission: RE | Admit: 2022-03-08 | Discharge: 2022-03-08 | Disposition: A | Discharge status: Home / Self Care | Payer: Medicare HMO | Source: Ambulatory Visit | Attending: Cardiovascular Disease | Admitting: Cardiovascular Disease

## 2022-03-08 ENCOUNTER — Ambulatory Visit (HOSPITAL_BASED_OUTPATIENT_CLINIC_OR_DEPARTMENT_OTHER)
Admission: RE | Admit: 2022-03-08 | Discharge: 2022-03-08 | Disposition: A | Discharge status: Home / Self Care | Payer: Medicare HMO | Source: Ambulatory Visit | Attending: Cardiology | Admitting: Cardiology

## 2022-03-08 ENCOUNTER — Other Ambulatory Visit: Payer: Self-pay | Admitting: Cardiology

## 2022-03-08 ENCOUNTER — Ambulatory Visit (HOSPITAL_COMMUNITY)
Admission: RE | Admit: 2022-03-08 | Discharge: 2022-03-08 | Disposition: A | Discharge status: Home / Self Care | Payer: Medicare HMO | Source: Ambulatory Visit | Attending: Cardiology | Admitting: Cardiology

## 2022-03-08 DIAGNOSIS — R931 Abnormal findings on diagnostic imaging of heart and coronary circulation: Secondary | ICD-10-CM

## 2022-03-08 DIAGNOSIS — I2510 Atherosclerotic heart disease of native coronary artery without angina pectoris: Secondary | ICD-10-CM

## 2022-03-08 MED ORDER — NITROGLYCERIN 0.4 MG SL SUBL
SUBLINGUAL_TABLET | SUBLINGUAL | Status: AC
  Filled 2022-03-08: qty 2

## 2022-03-08 MED ORDER — IOHEXOL 350 MG/ML SOLN
100.0000 mL | Freq: Once | INTRAVENOUS | Status: AC | PRN
  Administered 2022-03-08: 100 mL via INTRAVENOUS

## 2022-03-08 MED ORDER — NITROGLYCERIN 0.4 MG SL SUBL
0.8000 mg | SUBLINGUAL_TABLET | Freq: Once | SUBLINGUAL | Status: AC
  Administered 2022-03-08: 0.8 mg via SUBLINGUAL

## 2022-03-10 ENCOUNTER — Other Ambulatory Visit: Payer: Self-pay

## 2022-03-10 ENCOUNTER — Telehealth: Payer: Self-pay | Admitting: Cardiovascular Disease

## 2022-03-10 DIAGNOSIS — R0602 Shortness of breath: Secondary | ICD-10-CM

## 2022-03-10 NOTE — Telephone Encounter (Signed)
Pt calling to f/u on CT results. Please advise

## 2022-03-10 NOTE — Telephone Encounter (Signed)
Called pt to let him know his CT has not been reviewed yet. He verbalized understanding. Pt states "I'm not good with MyChart. Is there anyway y'all can call me with the results?" Will get message to Dr. Audie Box.

## 2022-03-10 NOTE — Telephone Encounter (Signed)
Called patient, advised of CT results.  Patient was scheduled for his upcoming ECHO- and will have completed.   Patient was thankful for call back.

## 2022-03-23 ENCOUNTER — Ambulatory Visit (HOSPITAL_COMMUNITY): Payer: Medicare HMO | Attending: Cardiology

## 2022-03-23 DIAGNOSIS — R0602 Shortness of breath: Secondary | ICD-10-CM | POA: Diagnosis not present

## 2022-03-23 LAB — ECHOCARDIOGRAM COMPLETE
Area-P 1/2: 2.69 cm2
S' Lateral: 3.2 cm

## 2022-05-16 NOTE — Progress Notes (Unsigned)
Cardiology Office Note:   Date:  05/19/2022  NAME:  NIKASH MORTENSEN    MRN: [id number] DOB:  [birth year]   PCP:  Gaynelle Arabian, MD  Cardiologist:  None  Electrophysiologist:  None   Referring MD: Gaynelle Arabian, MD   Chief Complaint  Patient presents with   Follow-up   History of Present Illness:   BOYSIE BONEBRAKE is a 66 y.o. male with a hx of CAD, hyperlipidemia, tobacco abuse who presents for follow-up.  Coronary CTA with nonobstructive CAD.  On Lipitor 40 mg daily.  Tolerating this well.  No chest pain or trouble breathing.  Still smoking cigarettes with 1 pack/day.  Working on quitting.  We discussed the importance of smoking cessation.  He is started to exercise a bit.  Doing well.  His blood pressure is well controlled.  He is working on his diet as well.  Overall without symptoms of angina.  Testing reassuring.  Problem List CAD -CAC score 874 (89th percentile) -50-69% pRCA (CT FFR 0.82) 2. HLD -T chol 113, HDL 29, LDL 39, TG 298 3. Tobacco abuse -35 pack-years   Past Medical History: Past Medical History:  Diagnosis Date   Cataract    ED (erectile dysfunction)    Hyperlipidemia    PUD (peptic ulcer disease)    Sleep apnea     Past Surgical History: Past Surgical History:  Procedure Laterality Date   LEFT KNEE CYST      Current Medications: Current Meds  Medication Sig   ibuprofen (ADVIL) 200 MG tablet Take 200 mg by mouth daily.   Melatonin (MELATONIN EXTRA STRENGTH) 10 MG TABS 1 gummy   Omega-3 Fatty Acids (FISH OIL) 1000 MG CAPS 1 capsule   [DISCONTINUED] atorvastatin (LIPITOR) 40 MG tablet Take 1 tablet (40 mg total) by mouth daily.     Allergies:    Patient has no known allergies.   Social History: Social History   Socioeconomic History   Marital status: Married    Spouse name: Not on file   Number of children: 2   Years of education: Not on file   Highest education level: Not on file  Occupational History   Occupation: Retired [place]  Tobacco Use   Smoking status: Every Day    Years: 35.00    Types: Cigarettes   Smokeless tobacco: Not on file  Substance and Sexual Activity   Alcohol use: No    Alcohol/week: 0.0 standard drinks of alcohol   Drug use: No   Sexual activity: Not on file  Other Topics Concern   Not on file  Social History Narrative   Not on file   Social Determinants of Health   Financial Resource Strain: Not on file  Food Insecurity: Not on file  Transportation Needs: Not on file  Physical Activity: Not on file  Stress: Not on file  Social Connections: Not on file     Family History: The patient's family history includes COPD in his father; Cancer in his sister; Diabetes in his mother; Heart disease in his mother; Hyperlipidemia in his mother.  ROS:   All other ROS reviewed and negative. Pertinent positives noted in the HPI.     EKGs/Labs/Other Studies Reviewed:   The following studies were personally reviewed by me today:  TTE 03/23/2022  1. Left ventricular ejection fraction, by estimation, is 55 to 60%. Left  ventricular ejection fraction by 3D volume is 56 %. The left ventricle has  normal function. The left ventricle has  no regional wall motion  abnormalities. Left ventricular diastolic   parameters are consistent with Grade I diastolic dysfunction (impaired  relaxation). The average left ventricular global longitudinal strain is  -21.4 %.   2. Right ventricular systolic function is normal. The right ventricular  size is normal. Tricuspid regurgitation signal is inadequate for assessing  PA pressure.   3. The mitral valve is normal in structure. Trivial mitral valve  regurgitation. No evidence of mitral stenosis.   4. The aortic valve is tricuspid. Aortic valve regurgitation is not  visualized. No aortic stenosis is present.   5. The inferior vena cava is normal in size with greater than 50%  respiratory variability, suggesting right atrial pressure of 3 mmHg.   Recent  Labs: 02/09/2022: BUN 15; Creatinine, Ser 0.75; Potassium 5.0; Sodium 144   Recent Lipid Panel No results found for: "CHOL", "TRIG", "HDL", "CHOLHDL", "VLDL", "LDLCALC", "LDLDIRECT"  Physical Exam:   VS:  BP 118/70 (BP Location: Left Arm, Patient Position: Sitting, Cuff Size: Normal)   Pulse 89   Ht '5\' 11"'$  (1.803 m)   Wt 192 lb (87.1 kg)   SpO2 99%   BMI 26.78 kg/m    Wt Readings from Last 3 Encounters:  05/19/22 192 lb (87.1 kg)  02/09/22 193 lb (87.5 kg)  05/06/15 190 lb (86.2 kg)    General: Well nourished, well developed, in no acute distress Head: Atraumatic, normal size  Eyes: PEERLA, EOMI  Neck: Supple, no JVD Endocrine: No thryomegaly Cardiac: Normal S1, S2; RRR; no murmurs, rubs, or gallops Lungs: Clear to auscultation bilaterally, no wheezing, rhonchi or rales  Abd: Soft, nontender, no hepatomegaly  Ext: No edema, pulses 2+ Musculoskeletal: No deformities, BUE and BLE strength normal and equal Skin: Warm and dry, no rashes   Neuro: Alert and oriented to person, place, time, and situation, CNII-XII grossly intact, no focal deficits  Psych: Normal mood and affect   ASSESSMENT:   MYCHAEL SMOCK is a 66 y.o. male who presents for the following: 1. Coronary artery disease involving native coronary artery of native heart without angina pectoris   2. Mixed hyperlipidemia   3. Tobacco abuse     PLAN:   1. Coronary artery disease involving native coronary artery of native heart without angina pectoris 2. Mixed hyperlipidemia -Coronary calcium score 874 which is 89 percentile.  Coronary CTA with moderate proximal RCA disease that was negative by CT FFR.  No symptoms concerning for angina.  Would recommend to continue with aggressive medical management.  He will take aspirin.  He is on Lipitor 40 mg daily.  We will recheck his lipids today.  Blood pressure is well controlled.  Still smoking.  See discussion below.  CV exam normal.  Echo was normal.  We discussed aggressive  diet and lifestyle modifications which she needs to pursue.  He will see [place] yearly.  3. Tobacco abuse -Still smoking 1 pack/day.  3 minutes of smoking cessation counseling was provided in office today.  I did stress to him the importance of smoking cessation to reduce his risk of heart disease progression.  CAD -CAC score 874 (89th percentile) -50-69% pRCA (CT FFR 0.82) 2. HLD -T chol 113, HDL 29, LDL 39, TG 298 3. Tobacco abuse -35 pack-years       Disposition: Return in about 1 year (around 05/20/2023).  Medication Adjustments/Labs and Tests Ordered: Current medicines are reviewed at length with the patient today.  Concerns regarding medicines are outlined above.  Orders Placed This  Encounter  Procedures   Lipid panel   Meds ordered this encounter  Medications   atorvastatin (LIPITOR) 40 MG tablet    Sig: Take 1 tablet (40 mg total) by mouth daily.    Dispense:  90 tablet    Refill:  3    Patient Instructions  Medication Instructions:  The current medical regimen is effective;  continue present plan and medications.  *If you need a refill on your cardiac medications before your next appointment, please call your pharmacy*   Lab Work: LIPID today   If you have labs (blood work) drawn today and your tests are completely normal, you will receive your results only by: [place] (if you have MyChart) OR A paper copy in the mail If you have any lab test that is abnormal or we need to change your treatment, we will call you to review the results.   Follow-Up: At [facility], you and your health needs are our priority.  As part of our continuing mission to provide you with exceptional heart care, we have created designated Provider Care Teams.  These Care Teams include your primary Cardiologist (physician) and Advanced Practice Providers (APPs -  Physician Assistants and Nurse Practitioners) who all work together to provide you with the care you need, when  you need it.  We recommend signing up for the patient portal called "MyChart".  Sign up information is provided on this After Visit Summary.  MyChart is used to connect with patients for Virtual Visits (Telemedicine).  Patients are able to view lab/test results, encounter notes, upcoming appointments, etc.  Non-urgent messages can be sent to your provider as well.   To learn more about what you can do with MyChart, go to [email or web address].    Your next appointment:   12 month(s)  The format for your next appointment:   In Person  Provider:   Eleonore Chiquito, MD or Sande Rives, PA-C, or Almyra Deforest, PA-C           Time Spent with Patient: I have spent a total of 35 minutes with patient reviewing hospital notes, telemetry, EKGs, labs and examining the patient as well as establishing an assessment and plan that was discussed with the patient.  > 50% of time was spent in direct patient care.  Signed, Addison Naegeli. Audie Box, MD, [place]  [address] [place], [place] [address] [phone]  05/19/2022 8:46 AM

## 2022-05-19 ENCOUNTER — Encounter: Payer: Self-pay | Admitting: Cardiovascular Disease

## 2022-05-19 ENCOUNTER — Ambulatory Visit: Payer: Medicare HMO | Attending: Cardiovascular Disease | Admitting: Cardiovascular Disease

## 2022-05-19 VITALS — BP 118/70 | HR 89 | Ht 71.0 in | Wt 192.0 lb

## 2022-05-19 DIAGNOSIS — I2510 Atherosclerotic heart disease of native coronary artery without angina pectoris: Secondary | ICD-10-CM

## 2022-05-19 DIAGNOSIS — F17210 Nicotine dependence, cigarettes, uncomplicated: Secondary | ICD-10-CM

## 2022-05-19 DIAGNOSIS — R69 Illness, unspecified: Secondary | ICD-10-CM | POA: Diagnosis not present

## 2022-05-19 DIAGNOSIS — E782 Mixed hyperlipidemia: Secondary | ICD-10-CM | POA: Diagnosis not present

## 2022-05-19 DIAGNOSIS — Z720 Tobacco use: Secondary | ICD-10-CM

## 2022-05-19 LAB — LIPID PANEL
Chol/HDL Ratio: 4.4 ratio (ref 0.0–5.0)
Cholesterol, Total: 131 mg/dL (ref 100–199)
HDL: 30 mg/dL — ABNORMAL LOW (ref >39)
LDL Chol Calc (NIH): 64 mg/dL (ref 0–99)
Triglycerides: 227 mg/dL — ABNORMAL HIGH (ref 0–149)
VLDL Cholesterol Cal: 37 mg/dL (ref 5–40)

## 2022-05-19 MED ORDER — ATORVASTATIN CALCIUM 40 MG PO TABS: 40.0000 mg | ORAL_TABLET | Freq: Every day | ORAL | 3 refills | Status: DC

## 2022-05-19 NOTE — Patient Instructions (Addendum)
Medication Instructions:  The current medical regimen is effective;  continue present plan and medications.  *If you need a refill on your cardiac medications before your next appointment, please call your pharmacy*   Lab Work: LIPID today   If you have labs (blood work) drawn today and your tests are completely normal, you will receive your results only by: [place] (if you have MyChart) OR A paper copy in the mail If you have any lab test that is abnormal or we need to change your treatment, we will call you to review the results.   Follow-Up: At [facility], you and your health needs are our priority.  As part of our continuing mission to provide you with exceptional heart care, we have created designated Provider Care Teams.  These Care Teams include your primary Cardiologist (physician) and Advanced Practice Providers (APPs -  Physician Assistants and Nurse Practitioners) who all work together to provide you with the care you need, when you need it.  We recommend signing up for the patient portal called "MyChart".  Sign up information is provided on this After Visit Summary.  MyChart is used to connect with patients for Virtual Visits (Telemedicine).  Patients are able to view lab/test results, encounter notes, upcoming appointments, etc.  Non-urgent messages can be sent to your provider as well.   To learn more about what you can do with MyChart, go to [email or web address].    Your next appointment:   12 month(s)  The format for your next appointment:   In Person  Provider:   Eleonore Chiquito, MD or Sande Rives, PA-C, or Almyra Deforest, [place]

## 2022-05-25 ENCOUNTER — Telehealth: Payer: Self-pay | Admitting: Cardiovascular Disease

## 2022-05-25 ENCOUNTER — Other Ambulatory Visit: Payer: Self-pay

## 2022-05-25 MED ORDER — ICOSAPENT ETHYL 1 G PO CAPS: 2.0000 g | ORAL_CAPSULE | Freq: Two times a day (BID) | ORAL | 3 refills | Status: AC

## 2022-05-25 NOTE — Telephone Encounter (Signed)
Called patient, advised of lab work results-  RX sent to pharmacy.   Patient has fish oil on his current medication do you recommend he stop this and do the Vascepa?   Thanks!

## 2022-05-25 NOTE — Telephone Encounter (Signed)
Called patient, made aware to stop over the counter fish oil and start Vascepa-  Patient verbalized understanding.

## 2022-05-25 NOTE — Telephone Encounter (Signed)
Follow Up:    Patient is calling to get his lab results from last week. If he does not answer, please leave a message. He is very concerned about his Cholesterol and Triglyceride

## 2022-10-05 DIAGNOSIS — D2271 Melanocytic nevi of right lower limb, including hip: Secondary | ICD-10-CM | POA: Diagnosis not present

## 2022-10-05 DIAGNOSIS — D225 Melanocytic nevi of trunk: Secondary | ICD-10-CM | POA: Diagnosis not present

## 2022-10-05 DIAGNOSIS — Z1283 Encounter for screening for malignant neoplasm of skin: Secondary | ICD-10-CM | POA: Diagnosis not present

## 2022-10-05 DIAGNOSIS — B078 Other viral warts: Secondary | ICD-10-CM | POA: Diagnosis not present

## 2022-10-05 DIAGNOSIS — L821 Other seborrheic keratosis: Secondary | ICD-10-CM | POA: Diagnosis not present

## 2022-10-05 DIAGNOSIS — D485 Neoplasm of uncertain behavior of skin: Secondary | ICD-10-CM | POA: Diagnosis not present

## 2022-12-07 DIAGNOSIS — Z0000 Encounter for general adult medical examination without abnormal findings: Secondary | ICD-10-CM | POA: Diagnosis not present

## 2022-12-07 DIAGNOSIS — R059 Cough, unspecified: Secondary | ICD-10-CM | POA: Diagnosis not present

## 2022-12-07 DIAGNOSIS — R69 Illness, unspecified: Secondary | ICD-10-CM | POA: Diagnosis not present

## 2022-12-07 DIAGNOSIS — R7309 Other abnormal glucose: Secondary | ICD-10-CM | POA: Diagnosis not present

## 2022-12-07 DIAGNOSIS — I2510 Atherosclerotic heart disease of native coronary artery without angina pectoris: Secondary | ICD-10-CM | POA: Diagnosis not present

## 2022-12-07 DIAGNOSIS — Z1331 Encounter for screening for depression: Secondary | ICD-10-CM | POA: Diagnosis not present

## 2022-12-07 DIAGNOSIS — Z79899 Other long term (current) drug therapy: Secondary | ICD-10-CM | POA: Diagnosis not present

## 2022-12-07 DIAGNOSIS — I700 Atherosclerosis of aorta: Secondary | ICD-10-CM | POA: Diagnosis not present

## 2022-12-07 DIAGNOSIS — E782 Mixed hyperlipidemia: Secondary | ICD-10-CM | POA: Diagnosis not present

## 2022-12-07 DIAGNOSIS — Z122 Encounter for screening for malignant neoplasm of respiratory organs: Secondary | ICD-10-CM | POA: Diagnosis not present

## 2022-12-07 DIAGNOSIS — R454 Irritability and anger: Secondary | ICD-10-CM | POA: Diagnosis not present

## 2023-01-12 DIAGNOSIS — R69 Illness, unspecified: Secondary | ICD-10-CM | POA: Diagnosis not present

## 2023-01-14 ENCOUNTER — Other Ambulatory Visit: Payer: Self-pay | Admitting: Acute Care

## 2023-01-14 DIAGNOSIS — F17210 Nicotine dependence, cigarettes, uncomplicated: Secondary | ICD-10-CM

## 2023-01-14 DIAGNOSIS — Z122 Encounter for screening for malignant neoplasm of respiratory organs: Secondary | ICD-10-CM

## 2023-01-14 DIAGNOSIS — Z87891 Personal history of nicotine dependence: Secondary | ICD-10-CM

## 2023-02-02 ENCOUNTER — Ambulatory Visit
Admission: RE | Admit: 2023-02-02 | Discharge: 2023-02-02 | Disposition: A | Discharge status: Home / Self Care | Payer: Medicare HMO | Source: Ambulatory Visit | Attending: Family Medicine | Admitting: Family Medicine

## 2023-02-02 DIAGNOSIS — F17210 Nicotine dependence, cigarettes, uncomplicated: Secondary | ICD-10-CM

## 2023-02-02 DIAGNOSIS — Z87891 Personal history of nicotine dependence: Secondary | ICD-10-CM | POA: Diagnosis not present

## 2023-02-02 DIAGNOSIS — Z122 Encounter for screening for malignant neoplasm of respiratory organs: Secondary | ICD-10-CM

## 2023-02-09 ENCOUNTER — Other Ambulatory Visit: Payer: Self-pay | Admitting: Acute Care

## 2023-02-09 DIAGNOSIS — Z122 Encounter for screening for malignant neoplasm of respiratory organs: Secondary | ICD-10-CM

## 2023-02-09 DIAGNOSIS — F17210 Nicotine dependence, cigarettes, uncomplicated: Secondary | ICD-10-CM

## 2023-02-09 DIAGNOSIS — Z87891 Personal history of nicotine dependence: Secondary | ICD-10-CM

## 2023-05-12 ENCOUNTER — Other Ambulatory Visit: Payer: Self-pay | Admitting: Cardiovascular Disease

## 2023-08-14 DIAGNOSIS — G250 Essential tremor: Secondary | ICD-10-CM | POA: Diagnosis not present

## 2023-11-15 DIAGNOSIS — F1420 Cocaine dependence, uncomplicated: Secondary | ICD-10-CM | POA: Diagnosis not present

## 2023-11-15 DIAGNOSIS — F1020 Alcohol dependence, uncomplicated: Secondary | ICD-10-CM | POA: Diagnosis not present

## 2023-11-16 DIAGNOSIS — F1020 Alcohol dependence, uncomplicated: Secondary | ICD-10-CM | POA: Diagnosis not present

## 2023-11-16 DIAGNOSIS — F1420 Cocaine dependence, uncomplicated: Secondary | ICD-10-CM | POA: Diagnosis not present

## 2023-11-17 DIAGNOSIS — F1020 Alcohol dependence, uncomplicated: Secondary | ICD-10-CM | POA: Diagnosis not present

## 2023-11-17 DIAGNOSIS — F1420 Cocaine dependence, uncomplicated: Secondary | ICD-10-CM | POA: Diagnosis not present

## 2023-11-20 DIAGNOSIS — F1420 Cocaine dependence, uncomplicated: Secondary | ICD-10-CM | POA: Diagnosis not present

## 2023-11-20 DIAGNOSIS — F1020 Alcohol dependence, uncomplicated: Secondary | ICD-10-CM | POA: Diagnosis not present

## 2023-11-21 DIAGNOSIS — F1420 Cocaine dependence, uncomplicated: Secondary | ICD-10-CM | POA: Diagnosis not present

## 2023-11-21 DIAGNOSIS — F1020 Alcohol dependence, uncomplicated: Secondary | ICD-10-CM | POA: Diagnosis not present

## 2023-11-22 DIAGNOSIS — F1420 Cocaine dependence, uncomplicated: Secondary | ICD-10-CM | POA: Diagnosis not present

## 2023-11-22 DIAGNOSIS — F1020 Alcohol dependence, uncomplicated: Secondary | ICD-10-CM | POA: Diagnosis not present

## 2023-11-24 DIAGNOSIS — F1420 Cocaine dependence, uncomplicated: Secondary | ICD-10-CM | POA: Diagnosis not present

## 2023-11-24 DIAGNOSIS — F1020 Alcohol dependence, uncomplicated: Secondary | ICD-10-CM | POA: Diagnosis not present

## 2023-11-27 DIAGNOSIS — F1020 Alcohol dependence, uncomplicated: Secondary | ICD-10-CM | POA: Diagnosis not present

## 2023-11-27 DIAGNOSIS — F1420 Cocaine dependence, uncomplicated: Secondary | ICD-10-CM | POA: Diagnosis not present

## 2024-02-12 ENCOUNTER — Other Ambulatory Visit: Payer: Self-pay | Admitting: *Deleted

## 2024-02-12 DIAGNOSIS — Z87891 Personal history of nicotine dependence: Secondary | ICD-10-CM

## 2024-02-12 DIAGNOSIS — Z122 Encounter for screening for malignant neoplasm of respiratory organs: Secondary | ICD-10-CM

## 2024-02-12 DIAGNOSIS — F17210 Nicotine dependence, cigarettes, uncomplicated: Secondary | ICD-10-CM

## 2024-02-15 ENCOUNTER — Ambulatory Visit
Admission: RE | Admit: 2024-02-15 | Discharge: 2024-02-15 | Discharge status: Home / Self Care | Source: Ambulatory Visit | Attending: Acute Care | Admitting: Acute Care

## 2024-02-15 DIAGNOSIS — Z87891 Personal history of nicotine dependence: Secondary | ICD-10-CM

## 2024-02-15 DIAGNOSIS — Z122 Encounter for screening for malignant neoplasm of respiratory organs: Secondary | ICD-10-CM | POA: Diagnosis not present

## 2024-02-15 DIAGNOSIS — F17210 Nicotine dependence, cigarettes, uncomplicated: Secondary | ICD-10-CM

## 2024-02-23 DIAGNOSIS — L259 Unspecified contact dermatitis, unspecified cause: Secondary | ICD-10-CM | POA: Diagnosis not present

## 2024-02-28 ENCOUNTER — Other Ambulatory Visit: Payer: Self-pay | Admitting: Acute Care

## 2024-02-28 DIAGNOSIS — Z87891 Personal history of nicotine dependence: Secondary | ICD-10-CM

## 2024-02-28 DIAGNOSIS — Z122 Encounter for screening for malignant neoplasm of respiratory organs: Secondary | ICD-10-CM

## 2024-03-06 ENCOUNTER — Telehealth: Payer: Self-pay | Admitting: *Deleted

## 2024-03-06 ENCOUNTER — Telehealth: Payer: Self-pay

## 2024-03-06 NOTE — Telephone Encounter (Signed)
 Spoke with patient and reviewed results. Patient had no questions. Advised result letter sent 02/28/2024.

## 2024-03-06 NOTE — Telephone Encounter (Signed)
 Copied from CRM [id number]. Topic: Clinical - Lab/Test Results >> Feb 22, 2024  2:20 PM Isabell A wrote: Reason for CRM: Patient calling to check on CT results - advised results are not back yet.Patient was thankful and verbalized understanding. >> Mar 05, 2024  3:44 PM Whitney O wrote: patient calling to get results from his lung cancer screening . Tried calling the lung cancer screening department no response and lost connection with patient . Tried callling him back but it went straight to voicemail please give patient a call concerning his results   Please contact patient.  Thank you.

## 2024-03-06 NOTE — Telephone Encounter (Signed)
 Copied from CRM [id number]. Topic: Clinical - Lab/Test Results >> Mar 05, 2024  3:54 PM Rilla B wrote: Reason for CRM: Patient called in and would like to go over CT results ordered by GORMAN Lites. Please call patient.  Please advise LCS CT. Thanks!

## 2024-03-06 NOTE — Telephone Encounter (Signed)
 Copied from CRM [id number]. Topic: Clinical - Lab/Test Results >> Mar 05, 2024  3:44 PM Whitney O wrote: patient calling to get results from his lung cancer screening . Tried calling the lung cancer screening department no response and lost connection with patient . Tried callling him back but it went straight to voicemail please give patient a call concerning his results

## 2024-03-06 NOTE — Telephone Encounter (Signed)
 Results reviewed with patient. See phone note from 03/06/2024.

## 2024-03-26 DIAGNOSIS — L308 Other specified dermatitis: Secondary | ICD-10-CM | POA: Diagnosis not present

## 2024-03-26 DIAGNOSIS — Z1283 Encounter for screening for malignant neoplasm of skin: Secondary | ICD-10-CM | POA: Diagnosis not present

## 2024-03-26 DIAGNOSIS — D225 Melanocytic nevi of trunk: Secondary | ICD-10-CM | POA: Diagnosis not present

## 2024-03-31 IMAGING — CT CT CHEST LUNG CANCER SCREENING LOW DOSE W/O CM
2 of 5 series · 15 of 40 positions shown, 18 images · non-contrast
Comparison: 08/07/2013 chest radiograph

CLINICAL DATA: Thirty-five pack-year smoking history/current
smoker.



[Series 2: lung 1.00 br60 axial · axial · 0.81mm/px · z∈[-1188,-874]mm · 12 of 348 slices shown, 15 images]
[im 17/348  mediastinal]
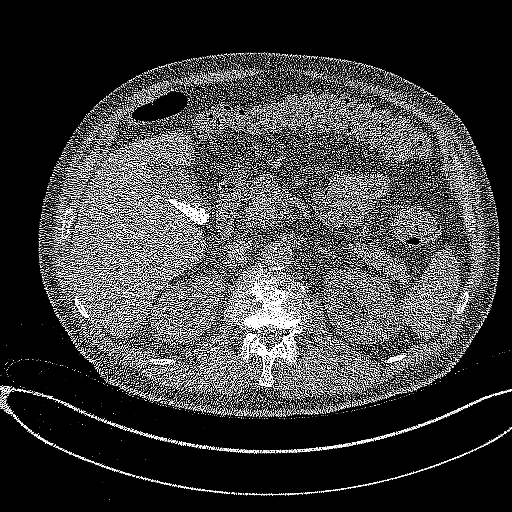
[im 17/348  lung]
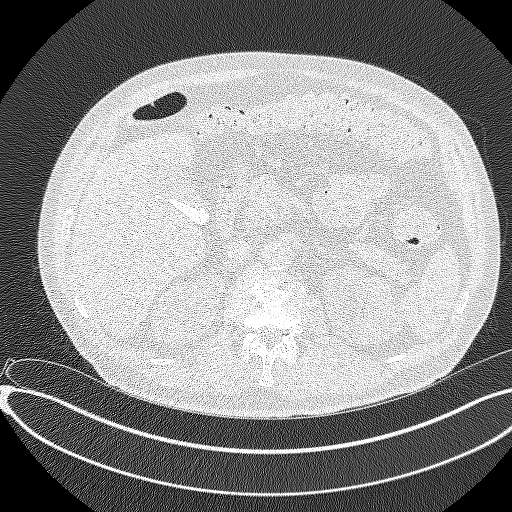
[im 50/348  lung]
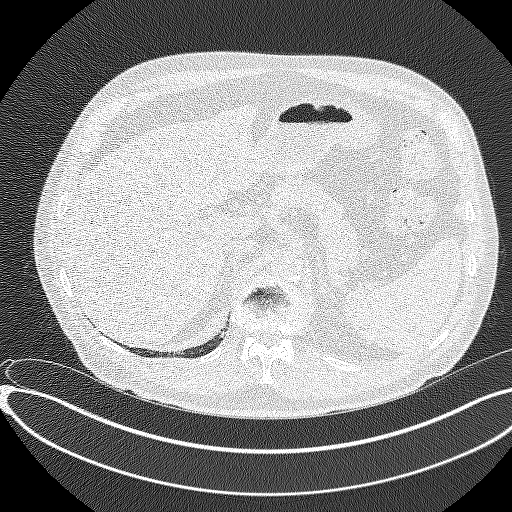
[im 83/348  lung]
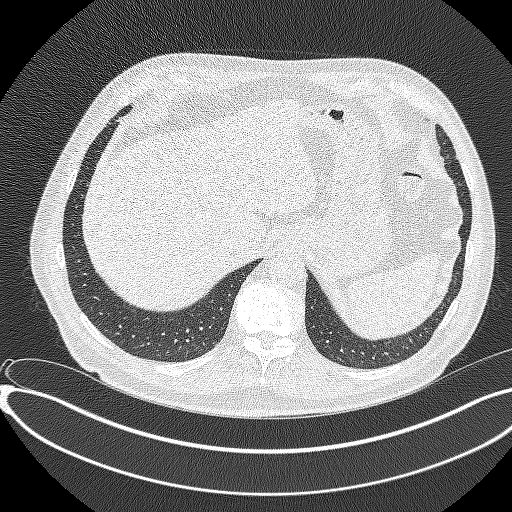
[im 100/348  lung]
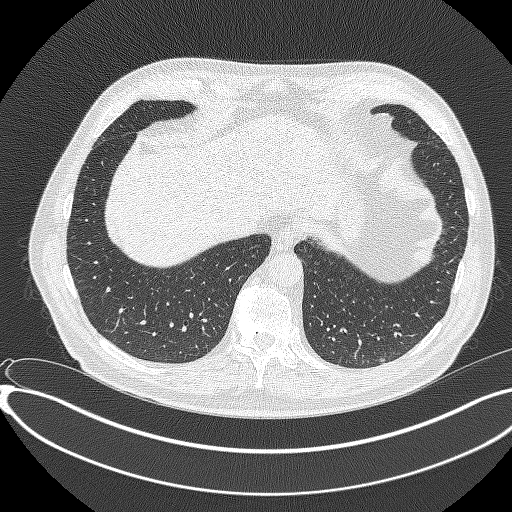
[im 133/348  mediastinal]
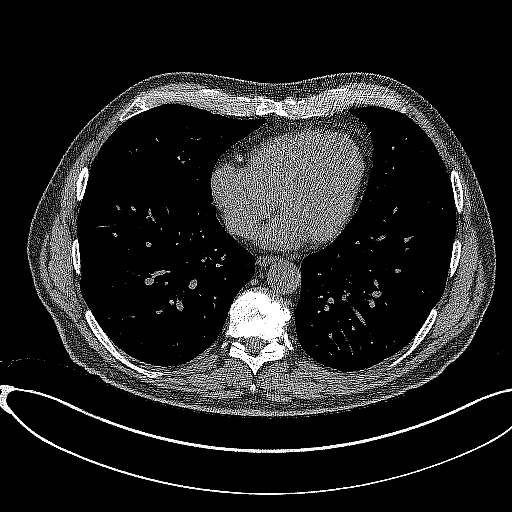
[im 133/348  lung]
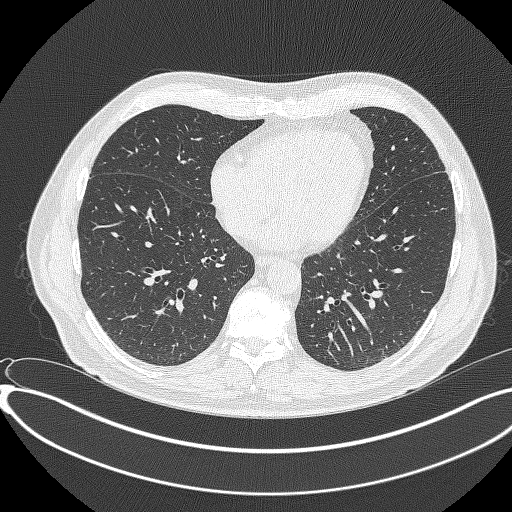
[im 166/348  lung]
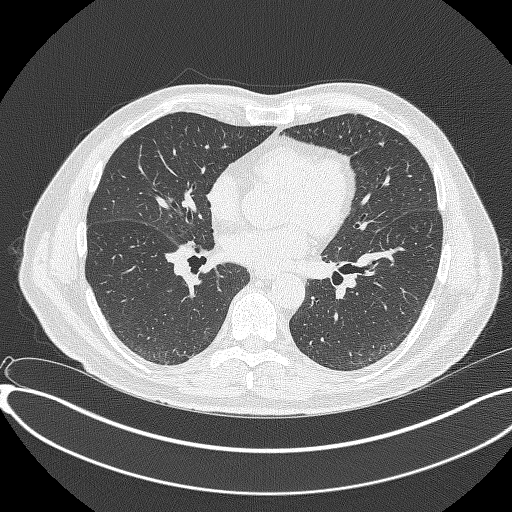
[im 182/348  lung]
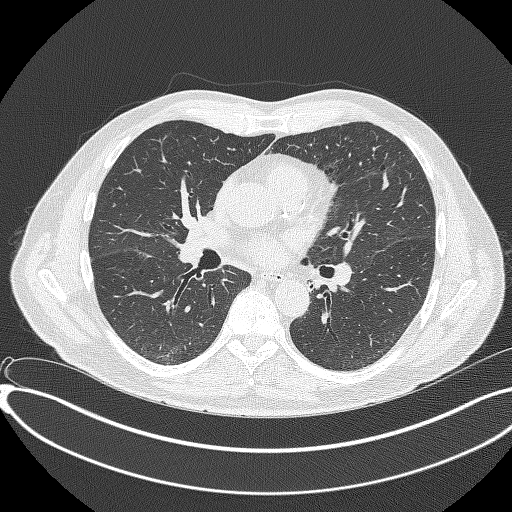
[im 215/348  lung]
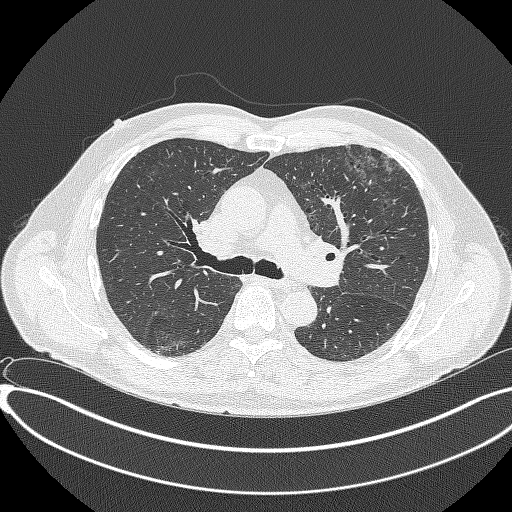
[im 248/348  mediastinal]
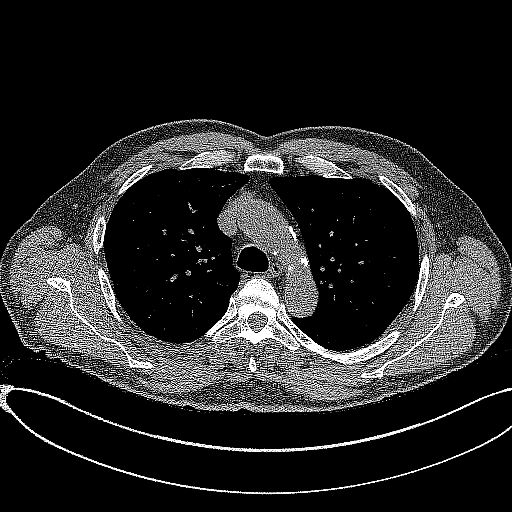
[im 248/348  lung]
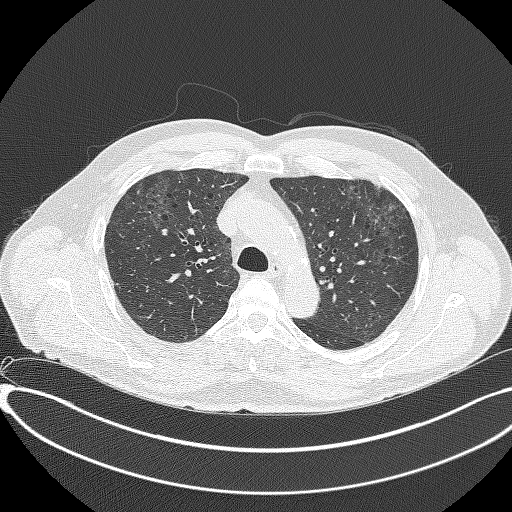
[im 265/348  lung]
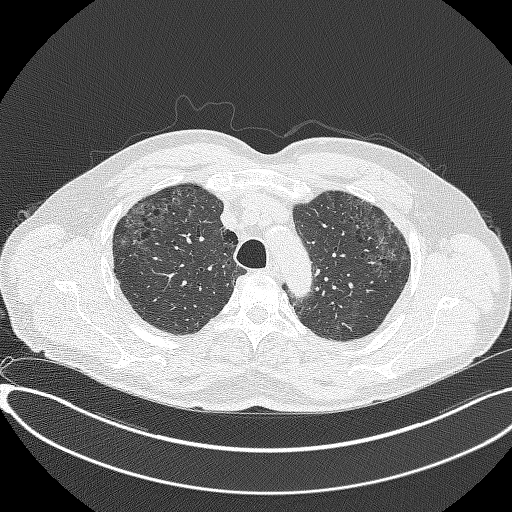
[im 298/348  lung]
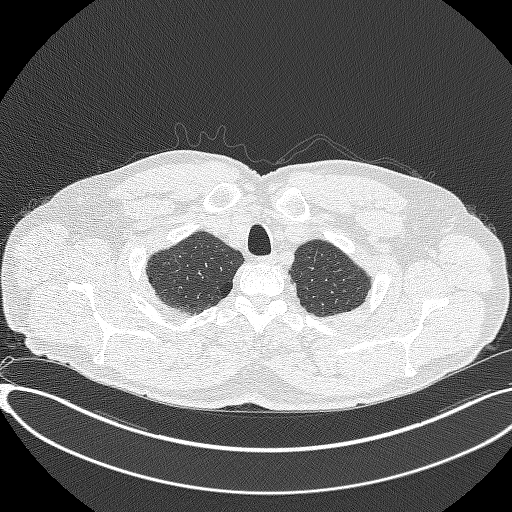
[im 331/348  lung]
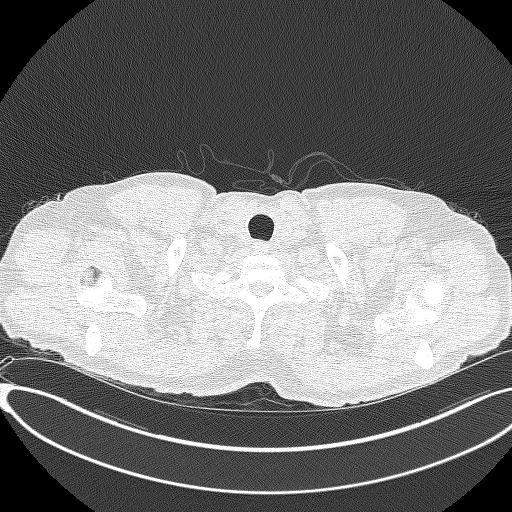

[Series 5: lung 1.00 br44 cor · coronal · 0.68mm/px · 3 of 316 slices shown]
[im 64/316  lung]
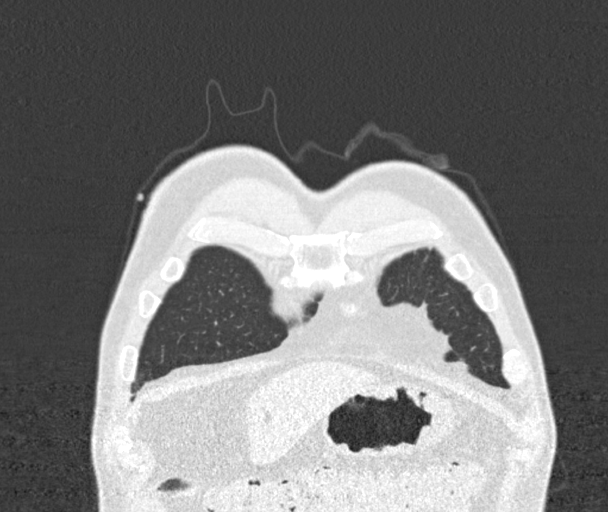
[im 127/316  lung]
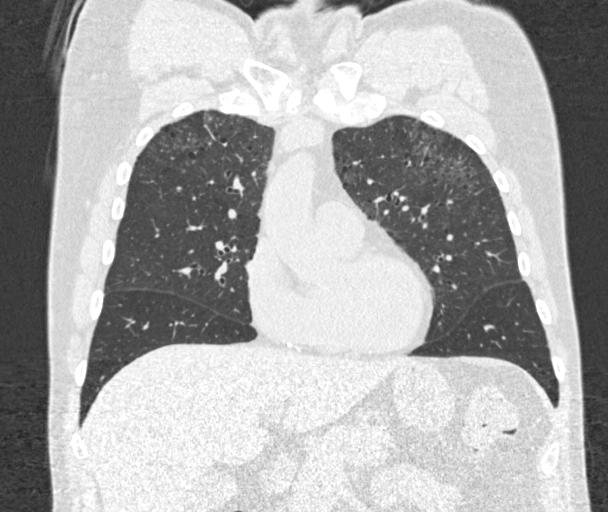
[im 190/316  lung]
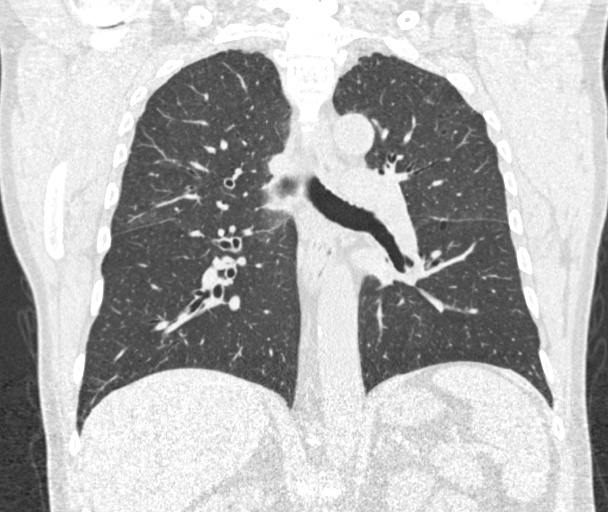

[15 of 40 positions shown; findings below may reference images not displayed]

FINDINGS: Cardiovascular: Aortic atherosclerosis. Normal heart size, without
pericardial effusion. Left main and 3 vessel coronary artery
calcification.

Mediastinum/Nodes: No mediastinal or definite hilar adenopathy,
given limitations of unenhanced CT.

Lungs/Pleura: No pleural fluid. Moderate centrilobular and
paraseptal emphysema. Patchy areas of ground-glass nodularity are
likely indicative of smoking related respiratory bronchiolitis.

No suspicious pulmonary nodule or mass.

Upper Abdomen: Multiple small gallstones. Normal imaged portions of
the liver, spleen, stomach, pancreas, adrenal glands, right kidney.
Inter/upper pole left renal 1.5 cm hypoattenuating lesion is
incompletely imaged but likely a cyst or minimally complex cyst.

Musculoskeletal: Lower cervical and lower thoracic spondylosis.
IMPRESSION: 1. Lung-RADS 1, negative. Continue annual screening with low-dose
chest CT without contrast in 12 months.
2. Cholelithiasis.
3. Aortic Atherosclerosis ([id number]) and Emphysema ([id number]).
Coronary artery atherosclerosis.

## 2024-06-13 ENCOUNTER — Other Ambulatory Visit: Payer: Self-pay | Admitting: Cardiovascular Disease

## 2024-07-23 ENCOUNTER — Other Ambulatory Visit: Payer: Self-pay | Admitting: Cardiovascular Disease

## 2024-08-11 ENCOUNTER — Other Ambulatory Visit: Payer: Self-pay | Admitting: Cardiovascular Disease

## 2024-09-10 ENCOUNTER — Other Ambulatory Visit: Payer: Self-pay | Admitting: Cardiovascular Disease

## 2024-09-24 ENCOUNTER — Telehealth: Payer: Self-pay | Admitting: Cardiovascular Disease

## 2024-09-24 MED ORDER — ATORVASTATIN CALCIUM 40 MG PO TABS: 40.0000 mg | ORAL_TABLET | Freq: Every day | ORAL | 0 refills | Status: AC

## 2024-09-24 NOTE — Telephone Encounter (Signed)
 Pt scheduled 11/25/24, refill sent until appointment.

## 2024-09-24 NOTE — Telephone Encounter (Signed)
" °*  STAT* If patient is at the pharmacy, call can be transferred to refill team.   1. Which medications need to be refilled? (please list name of each medication and dose if known)           atorvastatin  (LIPITOR) 40 MG tablet      4. Which pharmacy/location (including street and city if local pharmacy) is medication to be sent to? [id number] - [place], [place] - [address]    5. Do they need a 30 day or 90 day supply? 90  scheduled 11/25/24  "

## 2024-11-25 ENCOUNTER — Ambulatory Visit: Admitting: Cardiovascular Disease
